# Patient Record
Sex: Male | Born: 1970 | Race: White | Hispanic: No | Marital: Married
Health system: Southern US, Community
[De-identification: ages and names within clinical notes are randomized; demographics above are authoritative.]

---

## 2001-01-13 ENCOUNTER — Emergency Department (HOSPITAL_COMMUNITY): Admission: EM | Admit: 2001-01-13 | Discharge: 2001-01-13 | Payer: Self-pay

## 2007-02-03 ENCOUNTER — Emergency Department (HOSPITAL_COMMUNITY): Admission: EM | Admit: 2007-02-03 | Discharge: 2007-02-03 | Payer: Self-pay | Admitting: Emergency Medicine

## 2008-07-23 IMAGING — CR DG ANKLE COMPLETE 3+V*L*
3 series · 3 of 3 positions shown · non-contrast
Comparison: none

CLINICAL DATA: Fell with pain and swelling.
 LEFT ANKLE ? 3 VIEW:

[t ankle joint ap left]
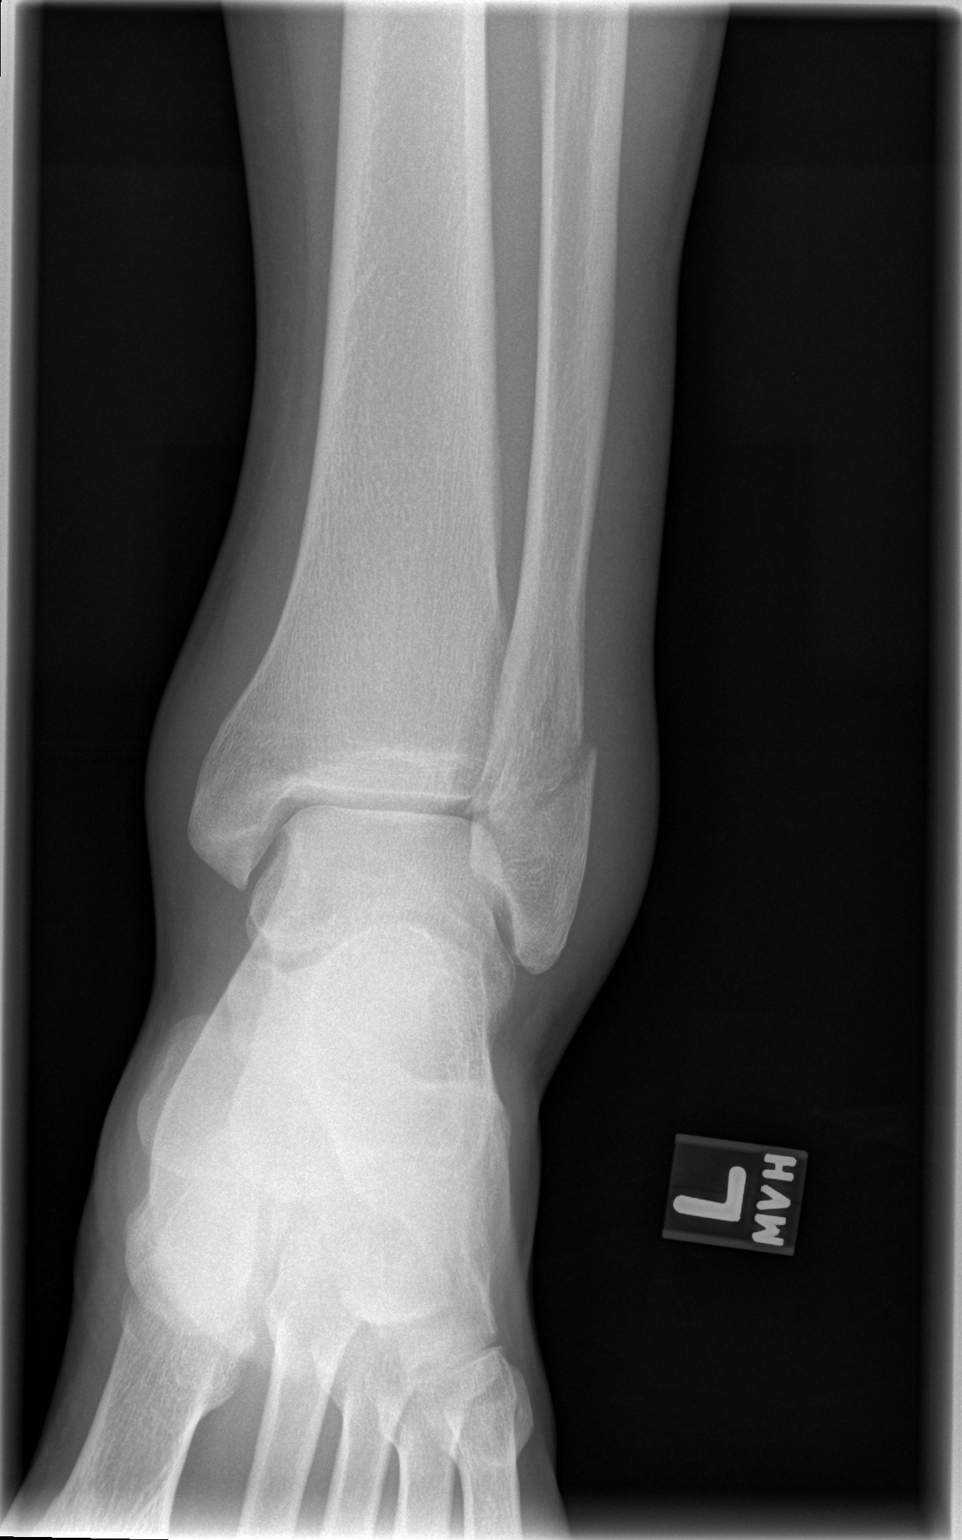

[t ankle joint oblique left]
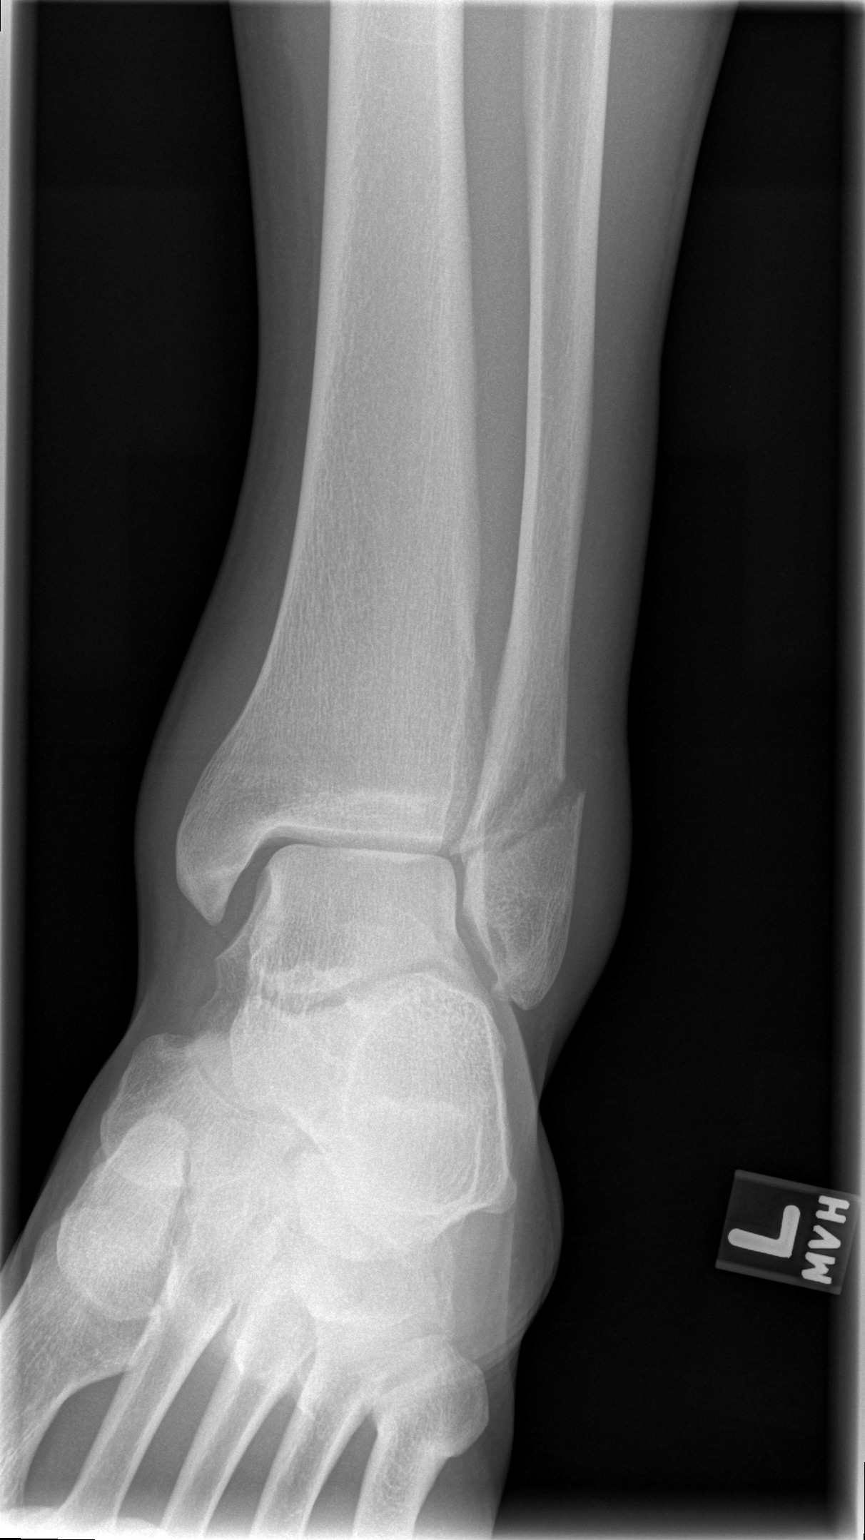

[t ankle joint lat left]
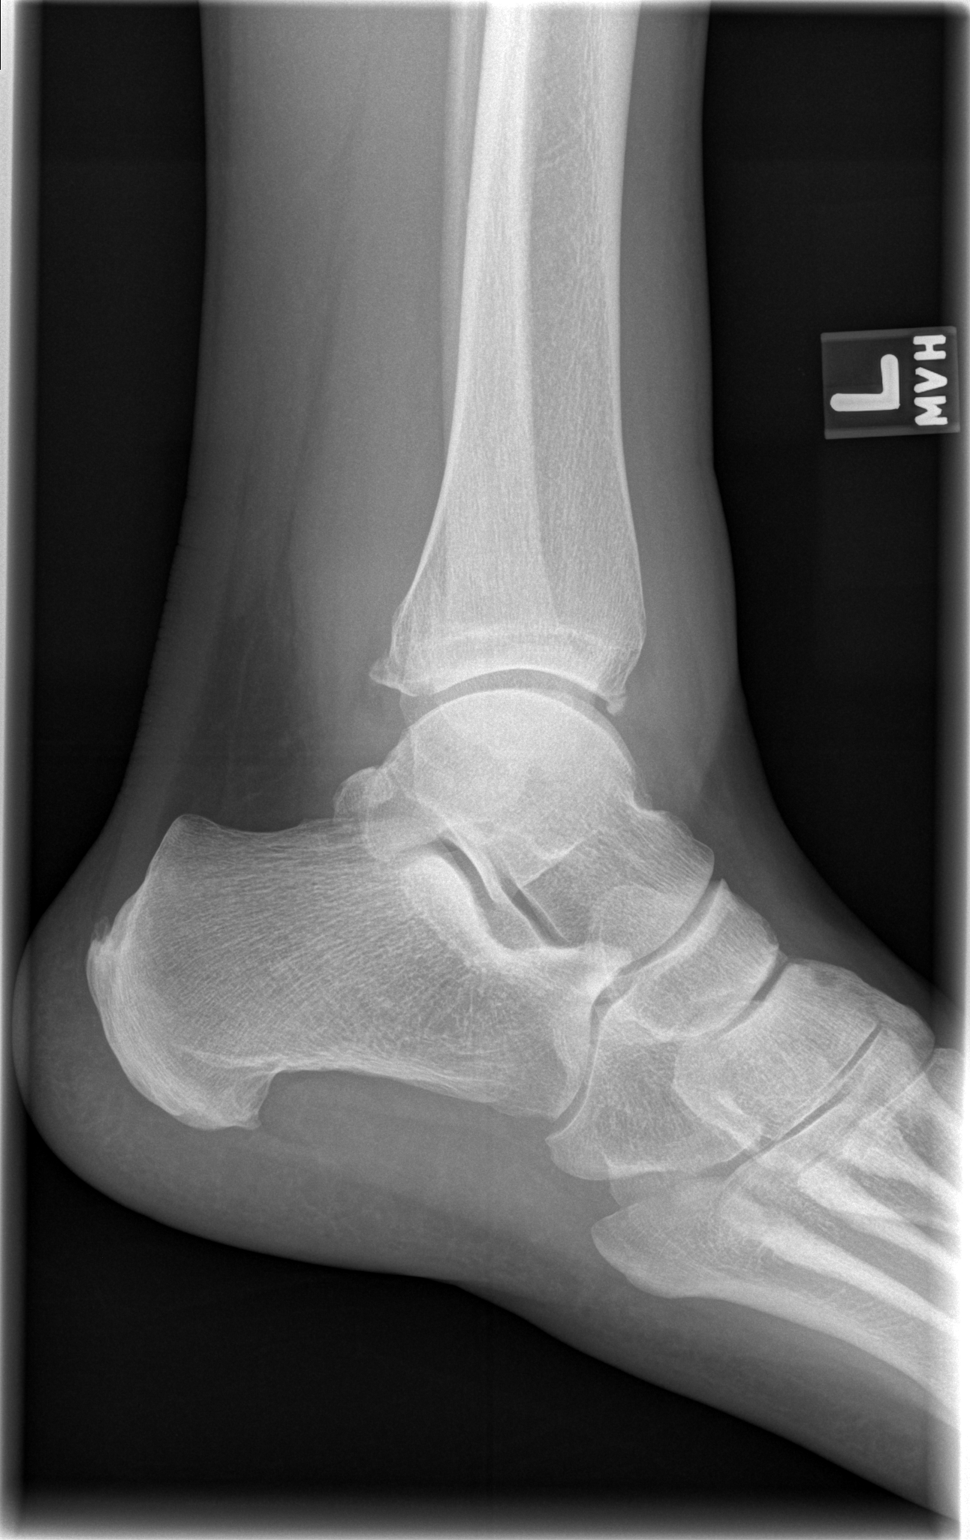

[3 of 3 positions shown; findings below may reference images not displayed]

FINDINGS: There is an oblique fracture of the distal fibula at the level of the ankle joint.  There is a fracture of the posterior lip of the tibia.  No medial malleolar fracture. There is regional soft tissue swelling.
IMPRESSION: Distal fibular fracture and posterior lip tibial lip fracture.

## 2019-09-17 ENCOUNTER — Ambulatory Visit: Payer: BC Managed Care – PPO | Attending: Internal Medicine

## 2019-09-17 DIAGNOSIS — Z23 Encounter for immunization: Secondary | ICD-10-CM

## 2019-09-17 NOTE — Progress Notes (Signed)
   [facility] Vaccination [facility]  Name:  Michael Hodges    MRN: [id number] DOB: February 06, 1971  09/17/2019  Mr. [name] was observed post Covid-19 immunization for 15 minutes without incident. He was provided with Vaccine Information Sheet and instruction to access the V-Safe system.   Mr. [name] was instructed to call 911 with any severe reactions post vaccine: Marland Kitchen Difficulty breathing  . Swelling of face and throat  . A fast heartbeat  . A bad rash all over body  . Dizziness and weakness   Immunizations Administered    Name Date Dose VIS Date Route   Pfizer COVID-19 Vaccine 09/17/2019 10:16 AM 0.3 mL 06/12/2019 Intramuscular   Manufacturer: [employer], [employer]   Lot: [id number]   NDC: [address]-1

## 2019-10-12 ENCOUNTER — Ambulatory Visit: Payer: BC Managed Care – PPO | Attending: Internal Medicine

## 2019-10-12 DIAGNOSIS — Z23 Encounter for immunization: Secondary | ICD-10-CM

## 2019-10-12 NOTE — Progress Notes (Signed)
   Covid-19 Vaccination Clinic  Name:  Michael Hodges    MRN: [id number] DOB: March 29, 1971  10/12/2019  [name] was observed post Covid-19 immunization for 15 minutes without incident. He was provided with Vaccine Information Sheet and instruction to access the V-Safe system.   [name] was instructed to call 911 with any severe reactions post vaccine: Marland Kitchen Difficulty breathing  . Swelling of face and throat  . A fast heartbeat  . A bad rash all over body  . Dizziness and weakness   Immunizations Administered    Name Date Dose VIS Date Route   Pfizer COVID-19 Vaccine 10/12/2019  9:20 AM 0.3 mL 06/12/2019 Intramuscular   Manufacturer: [employer], [employer]   Lot: [id number]   NDC: [address]-8
